# Patient Record
Sex: Female | Born: 1995 | Race: White | Hispanic: No | Marital: Married | Smoking: Never smoker
Health system: Southern US, Community
[De-identification: ages and names within clinical notes are randomized; demographics above are authoritative.]

## PROBLEM LIST (undated history)

## (undated) DIAGNOSIS — N390 Urinary tract infection, site not specified: Secondary | ICD-10-CM

---
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
## 2015-05-27 ENCOUNTER — Emergency Department (HOSPITAL_COMMUNITY): Payer: Medicaid Other

## 2015-05-27 ENCOUNTER — Emergency Department (HOSPITAL_COMMUNITY)
Admission: EM | Admit: 2015-05-27 | Discharge: 2015-05-27 | Disposition: A | Discharge status: Home / Self Care | Payer: Medicaid Other | Attending: Emergency Medicine | Admitting: Emergency Medicine

## 2015-05-27 ENCOUNTER — Encounter (HOSPITAL_COMMUNITY): Payer: Self-pay | Admitting: Emergency Medicine

## 2015-05-27 DIAGNOSIS — Z79899 Other long term (current) drug therapy: Secondary | ICD-10-CM | POA: Diagnosis not present

## 2015-05-27 DIAGNOSIS — M549 Dorsalgia, unspecified: Secondary | ICD-10-CM | POA: Diagnosis not present

## 2015-05-27 DIAGNOSIS — Z3202 Encounter for pregnancy test, result negative: Secondary | ICD-10-CM | POA: Diagnosis not present

## 2015-05-27 DIAGNOSIS — Z87440 Personal history of urinary (tract) infections: Secondary | ICD-10-CM | POA: Diagnosis not present

## 2015-05-27 DIAGNOSIS — R109 Unspecified abdominal pain: Secondary | ICD-10-CM

## 2015-05-27 DIAGNOSIS — R0789 Other chest pain: Secondary | ICD-10-CM | POA: Insufficient documentation

## 2015-05-27 DIAGNOSIS — Z793 Long term (current) use of hormonal contraceptives: Secondary | ICD-10-CM | POA: Insufficient documentation

## 2015-05-27 DIAGNOSIS — R0781 Pleurodynia: Secondary | ICD-10-CM

## 2015-05-27 DIAGNOSIS — Z9889 Other specified postprocedural states: Secondary | ICD-10-CM | POA: Insufficient documentation

## 2015-05-27 HISTORY — DX: Urinary tract infection, site not specified: N39.0

## 2015-05-27 LAB — COMPREHENSIVE METABOLIC PANEL
ALT: 15 U/L (ref 14–54)
ANION GAP: 7 (ref 5–15)
AST: 16 U/L (ref 15–41)
Albumin: 3.4 g/dL — ABNORMAL LOW (ref 3.5–5.0)
Alkaline Phosphatase: 72 U/L (ref 38–126)
BILIRUBIN TOTAL: 0.3 mg/dL (ref 0.3–1.2)
BUN: 8 mg/dL (ref 6–20)
CHLORIDE: 103 mmol/L (ref 101–111)
CO2: 27 mmol/L (ref 22–32)
CREATININE: 0.66 mg/dL (ref 0.44–1.00)
Calcium: 8.6 mg/dL — ABNORMAL LOW (ref 8.9–10.3)
GFR calc Af Amer: >60 mL/min (ref >60)
GFR calc non Af Amer: >60 mL/min (ref >60)
GLUCOSE: 84 mg/dL (ref 65–99)
Potassium: 4.4 mmol/L (ref 3.5–5.1)
Sodium: 137 mmol/L (ref 135–145)
Total Protein: 7.3 g/dL (ref 6.5–8.1)

## 2015-05-27 LAB — CBC WITH DIFFERENTIAL/PLATELET
BASOS ABS: 0 10*3/uL (ref 0.0–0.1)
BASOS PCT: 0 % (ref 0–1)
Eosinophils Absolute: 0.2 10*3/uL (ref 0.0–0.7)
Eosinophils Relative: 2 % (ref 0–5)
HEMATOCRIT: 33 % — AB (ref 36.0–46.0)
Hemoglobin: 10.8 g/dL — ABNORMAL LOW (ref 12.0–15.0)
Lymphocytes Relative: 24 % (ref 12–46)
Lymphs Abs: 1.8 10*3/uL (ref 0.7–4.0)
MCH: 28.2 pg (ref 26.0–34.0)
MCHC: 32.7 g/dL (ref 30.0–36.0)
MCV: 86.2 fL (ref 78.0–100.0)
Monocytes Absolute: 0.3 10*3/uL (ref 0.1–1.0)
Monocytes Relative: 4 % (ref 3–12)
NEUTROS ABS: 5.3 10*3/uL (ref 1.7–7.7)
Neutrophils Relative %: 70 % (ref 43–77)
PLATELETS: 328 10*3/uL (ref 150–400)
RBC: 3.83 MIL/uL — ABNORMAL LOW (ref 3.87–5.11)
RDW: 12.7 % (ref 11.5–15.5)
WBC: 7.7 10*3/uL (ref 4.0–10.5)

## 2015-05-27 LAB — URINALYSIS, ROUTINE W REFLEX MICROSCOPIC
Bilirubin Urine: NEGATIVE
GLUCOSE, UA: NEGATIVE mg/dL
Ketones, ur: NEGATIVE mg/dL
NITRITE: NEGATIVE
PH: 8 (ref 5.0–8.0)
Protein, ur: NEGATIVE mg/dL
Specific Gravity, Urine: 1.01 (ref 1.005–1.030)
Urobilinogen, UA: 0.2 mg/dL (ref 0.0–1.0)

## 2015-05-27 LAB — URINE MICROSCOPIC-ADD ON

## 2015-05-27 LAB — D-DIMER, QUANTITATIVE: D-Dimer, Quant: 3.81 ug/mL-FEU — ABNORMAL HIGH (ref 0.00–0.48)

## 2015-05-27 LAB — PREGNANCY, URINE: Preg Test, Ur: NEGATIVE

## 2015-05-27 MED ORDER — IOHEXOL 350 MG/ML SOLN
100.0000 mL | Freq: Once | INTRAVENOUS | Status: AC | PRN
  Administered 2015-05-27: 100 mL via INTRAVENOUS

## 2015-05-27 MED ORDER — TRAMADOL HCL 50 MG PO TABS: 50.0000 mg | ORAL_TABLET | Freq: Four times a day (QID) | ORAL | Status: AC | PRN

## 2015-05-27 NOTE — ED Notes (Signed)
Patient c/o right flank pain. Per patient worse with walking and deep breath. Patient states pain started in right shoulder, in which she saw PCP and given muscle relaxer. Patient took muscle relaxer and went to sleep waking with pain in flank. Denies any nausea, vomiting, diarrhea, urinary symptoms, or fevers.

## 2015-05-27 NOTE — ED Provider Notes (Signed)
CSN: [id number]     Arrival date & time 05/27/15  1027 History   This chart was scribed for Burgess AmorJulie Rolf Fells, PA-C working with No att. providers found by Elveria Risingimelie Horne, ED Scribe. This patient was seen in room APA10/APA10 and the patient's care was started at 11:55 AM.   Chief Complaint  Patient presents with  . Flank Pain   The history is provided by the patient. No language interpreter was used.   HPI Comments: Stacy Hughes is a 19 y.o. female who presents to the Emergency Department complaining of right shoulder and right flank pain onset six days ago. Patient reports initiation of pain in her right shoulder for which she was evaluated (two days ago) and prescribed a muscle relaxer. Patient shares that her PCP attributed her pain to a "pulled muscle." Patient explains that she was experiencing associated right flank pain with her shoulder pain, but the pain to her right flank worsened after her PCP visit and treatment with the muscle relaxer. Patient describes sharp, intermittent flank pain that is exacerbated with movement and deep breathing. Patient shares recent diagnosis and antibiotic treatment, with Macrobid, for a UTI approximately one week ago. Patient reports urinary frequency as her only symptoms that has since resolved. Patient shares that when when she was diagnosed with the UTI she was menstruating and experiencing more intense cramping. Patient adds that once this pain resolved, her right shoulder pain developed. Patient denies fever, coughing, abdominal pain, wheezing, shortness of breath, urinary symptoms, nausea, or vomiting. 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 Abdominal surgeries include a single Cesarean section; approximately 1.5 years ago.  Patient denies alcohol or drug use.  Patient taking oral contraceptive.   Past Medical History  Diagnosis Date  . UTI (lower urinary tract infection)    Past Surgical History  Procedure Laterality Date  . Cesarean  section     History reviewed. No pertinent family history. History  Substance Use Topics  . Smoking status: Never Smoker   . Smokeless tobacco: Never Used  . Alcohol Use: No   OB History    Gravida Para Term Preterm AB TAB SAB Ectopic Multiple Living   1 1 1       1      Review of Systems  Constitutional: Negative for fever and chills.  Respiratory: Negative for cough, shortness of breath and wheezing.   Cardiovascular: Negative for chest pain and leg swelling.  Gastrointestinal: Negative for nausea, vomiting, abdominal pain, constipation and abdominal distention.  Genitourinary: Positive for flank pain. Negative for dysuria, urgency, frequency and difficulty urinating.  Musculoskeletal: Positive for back pain. Negative for joint swelling and gait problem.  Skin: Negative for rash.  Neurological: Negative for weakness and numbness.    Allergies  Review of patient's allergies indicates no known allergies.  Home Medications   Prior to Admission medications   Medication Sig Start Date End Date Taking? Authorizing Provider  ibuprofen (ADVIL,MOTRIN) 200 MG tablet Take 400-600 mg by mouth every 6 (six) hours as needed for moderate pain.   Yes Historical Provider, MD  Norgestimate-Ethinyl Estradiol Triphasic (ORTHO TRI-CYCLEN LO) 0.18/0.215/0.25 MG-25 MCG tab Take 1 tablet by mouth daily.   Yes Historical Provider, MD  nitrofurantoin, macrocrystal-monohydrate, (MACROBID) 100 MG capsule Take 100 mg by mouth 2 (two) times daily.    Historical Provider, MD  traMADol (ULTRAM) 50 MG tablet Take 1 tablet (50 mg total) by mouth every 6 (six) hours as needed for moderate pain. 05/27/15  Burgess Amor, PA-C   Triage Vitals: BP 118/73 mmHg  Pulse 85  Temp(Src) 98 F (36.7 C) (Oral)  Resp 16  Ht 5\' 7"  (1.702 m)  Wt 204 lb (92.534 kg)  BMI 31.94 kg/m2  SpO2 99%  LMP 05/20/2015 Physical Exam  Constitutional: She appears well-developed and well-nourished.  HENT:  Head: Normocephalic and  atraumatic.  Eyes: Conjunctivae are normal.  Neck: Normal range of motion.  Cardiovascular: Normal rate, regular rhythm, normal heart sounds and intact distal pulses.   Pulmonary/Chest: Effort normal and breath sounds normal. No respiratory distress. She has no wheezes. She has no rales.  Decreased breath sounds at right base with no wheezing or rhonchi.   Abdominal: Soft. Bowel sounds are normal. She exhibits no distension and no mass. There is no tenderness. There is no rebound and no guarding.  Musculoskeletal: Normal range of motion.  No ankle edema. Calves are nontender. No pain with active or passive ROM of right shoulder. Nontender to palpation.   Neurological: She is alert.  Skin: Skin is warm and dry.  Psychiatric: She has a normal mood and affect.  Nursing note and vitals reviewed.   ED Course  Procedures (including critical care time)  COORDINATION OF CARE: 12:09 PM- Plans to order blood work. Discussed treatment plan with patient at bedside and patient agreed to plan.   Labs Review Labs Reviewed  URINALYSIS, ROUTINE W REFLEX MICROSCOPIC (NOT AT [facility]) - Abnormal; Notable for the following:    Hgb urine dipstick TRACE (*)    Leukocytes, UA SMALL (*)    All other components within normal limits  URINE MICROSCOPIC-ADD ON - Abnormal; Notable for the following:    Squamous Epithelial / LPF FEW (*)    All other components within normal limits  CBC WITH DIFFERENTIAL/PLATELET - Abnormal; Notable for the following:    RBC 3.83 (*)    Hemoglobin 10.8 (*)    HCT 33.0 (*)    All other components within normal limits  COMPREHENSIVE METABOLIC PANEL - Abnormal; Notable for the following:    Calcium 8.6 (*)    Albumin 3.4 (*)    All other components within normal limits  D-DIMER, QUANTITATIVE (NOT AT [facility]) - Abnormal; Notable for the following:    D-Dimer, Quant 3.81 (*)    All other components within normal limits  PREGNANCY, URINE    Imaging Review Dg Chest 2  View  05/27/2015   CLINICAL DATA:  Patient reports pleuritic chest pain.  EXAM: CHEST  2 VIEW  COMPARISON:  None.  FINDINGS: Normal cardiac and mediastinal contours. No consolidative pulmonary opacity. No pleural effusion or pneumothorax. Regional skeleton is unremarkable.  IMPRESSION: No acute cardiopulmonary process.   Electronically Signed   By: Annia Belt M.D.   On: 05/27/2015 13:05   Ct Angio Chest Pe W/cm &/or Wo Cm  05/27/2015   CLINICAL DATA:  Patient with right-sided pleuritic chest pain for 6 days.  EXAM: CT ANGIOGRAPHY CHEST WITH CONTRAST  TECHNIQUE: Multidetector CT imaging of the chest was performed using the standard protocol during bolus administration of intravenous contrast. Multiplanar CT image reconstructions and MIPs were obtained to evaluate the vascular anatomy.  CONTRAST:  OMNIPAQUE IOHEXOL 350 MG/ML SOLN  COMPARISON:  Chest radiograph 05/27/2015  FINDINGS: Mediastinum/Nodes: No enlarged axillary, mediastinal or hilar lymphadenopathy. Normal heart size. No pericardial effusion. Aorta main pulmonary artery normal in caliber.  No evidence for pulmonary embolism.  Lungs/Pleura: Central airways are patent. Tiny bilateral pleural effusions. Dependent ground-glass opacities  within the bilateral lower lobes. No pneumothorax.  Upper abdomen: Unremarkable  Musculoskeletal: No aggressive or acute appearing osseous lesions.  Review of the MIP images confirms the above findings.  IMPRESSION: No evidence for pulmonary embolism.  Small bilateral pleural effusions. Dependent atelectasis within the bilateral lower lobes.   Electronically Signed   By: Annia Belt M.D.   On: 05/27/2015 14:46   US Abdomen Complete  05/27/2015   CLINICAL DATA:  Abdominal pain.  EXAM: ULTRASOUND ABDOMEN COMPLETE  COMPARISON:  None.  FINDINGS: Gallbladder: No gallstones or wall thickening visualized. No sonographic Murphy sign noted.  Common bile duct: Diameter: 2.4 mm  Liver: Normal echogenicity without focal lesion  or biliary dilatation.  IVC: Normal caliber  Pancreas: Sonographically unremarkable  Spleen: Normal size and echogenicity without focal lesions  Right Kidney: Length: 11.0 cm. Normal renal cortical thickness and echogenicity without focal lesions or hydronephrosis.  Left Kidney: Length: 11.0 cm. Normal renal cortical thickness and echogenicity without focal lesions or hydronephrosis.  Abdominal aorta: No aneurysm visualized.  Other findings: Trace of free fluid in Morison's pouch.  IMPRESSION: Unremarkable abdominal ultrasound examination.   Electronically Signed   By: Rudie Meyer M.D.   On: 05/27/2015 13:25     EKG Interpretation None      MDM   Final diagnoses:  Pleuritic chest pain  Flank pain, acute    Patients labs and/or radiological studies were reviewed and considered during the medical decision making and disposition process.  Results were also discussed with patient. Pt prescribed tramadol, also encouraged ibuprofen (pt has).  F/u with pcp in 1 week if sx persist.  I personally performed the services described in this documentation, which was scribed in my presence. The recorded information has been reviewed and is accurate.   Burgess Amor, PA-C 05/28/15 1720  Vanetta Mulders, MD 05/30/15 [phone]

## 2015-05-27 NOTE — Discharge Instructions (Signed)
Flank Pain °Flank pain refers to pain that is located on the side of the body between the upper abdomen and the back. The pain may occur over a short period of time (acute) or may be long-term or reoccurring (chronic). It may be mild or severe. Flank pain can be caused by many things. °CAUSES  °Some of the more common causes of flank pain include: °· Muscle strains.   °· Muscle spasms.   °· A disease of your spine (vertebral disk disease).   °· A lung infection (pneumonia).   °· Fluid around your lungs (pulmonary edema).   °· A kidney infection.   °· Kidney stones.   °· A very painful skin rash caused by the chickenpox virus (shingles).   °· Gallbladder disease.   °HOME CARE INSTRUCTIONS  °Home care will depend on the cause of your pain. In general, °· Rest as directed by your caregiver. °· Drink enough fluids to keep your urine clear or pale yellow. °· Only take over-the-counter or prescription medicines as directed by your caregiver. Some medicines may help relieve the pain. °· Tell your caregiver about any changes in your pain. °· Follow up with your caregiver as directed. °SEEK IMMEDIATE MEDICAL CARE IF:  °· Your pain is not controlled with medicine.   °· You have new or worsening symptoms. °· Your pain increases.   °· You have abdominal pain.   °· You have shortness of breath.   °· You have persistent nausea or vomiting.   °· You have swelling in your abdomen.   °· You feel faint or pass out.   °· You have blood in your urine. °· You have a fever or persistent symptoms for more than 2-3 days. °· You have a fever and your symptoms suddenly get worse. °MAKE SURE YOU:  °· Understand these instructions. °· Will watch your condition. °· Will get help right away if you are not doing well or get worse. °Document Released: 12/25/2005 Document Revised: 07/28/2012 Document Reviewed: 06/17/2012 °[employer]® Patient Information ©2015 [employer]. This information is not intended to replace advice given to you by your  health care provider. Make sure you discuss any questions you have with your health care provider. ° °

## 2016-02-03 IMAGING — US US ABDOMEN COMPLETE
1 series · 14 of 25 positions shown · non-contrast
Comparison: None.

CLINICAL DATA: Abdominal pain.

EXAM:
ULTRASOUND ABDOMEN COMPLETE

[Series 1: us abdomen complete · 0.18mm/px · 14 of 99 slices shown]
[im 1/99]
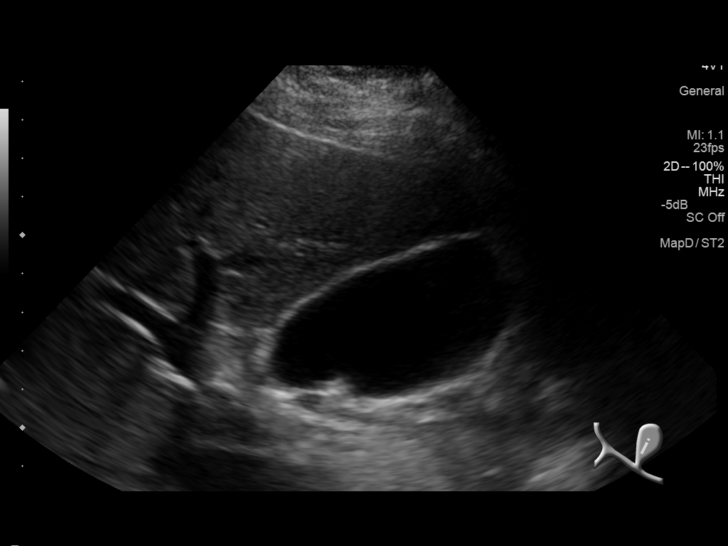
[im 9/99]
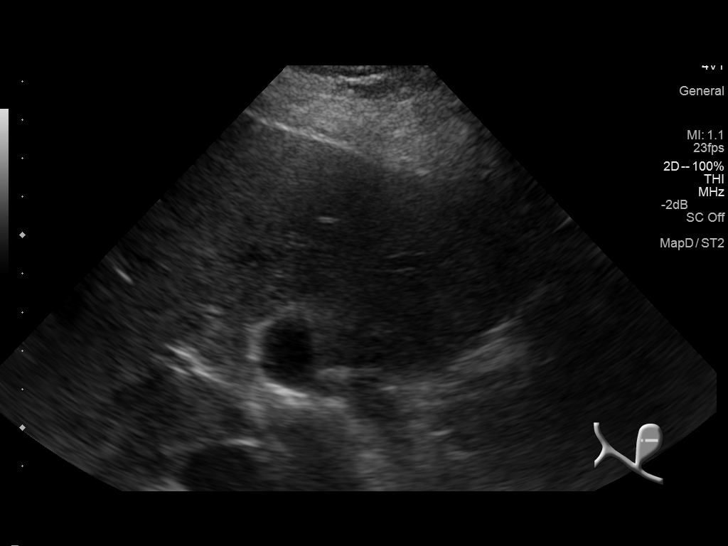
[im 17/99]
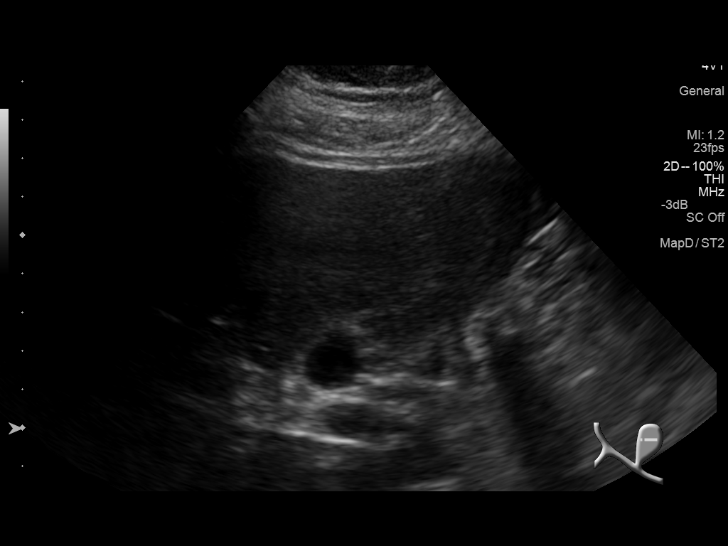
[im 25/99]
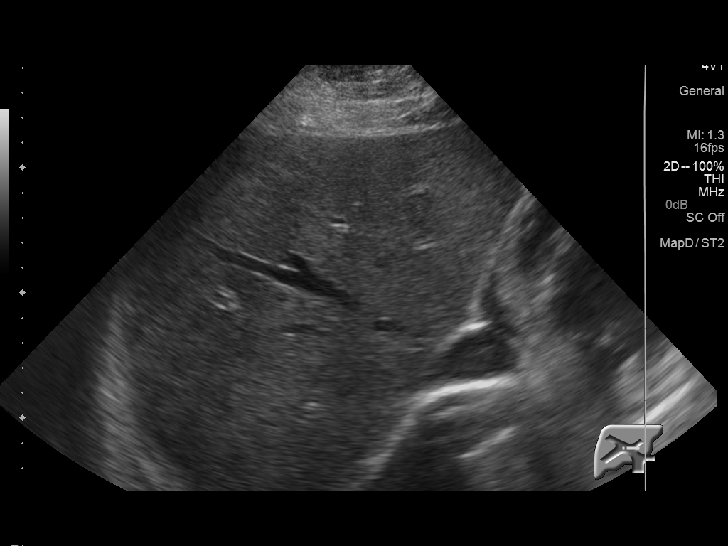
[im 33/99]
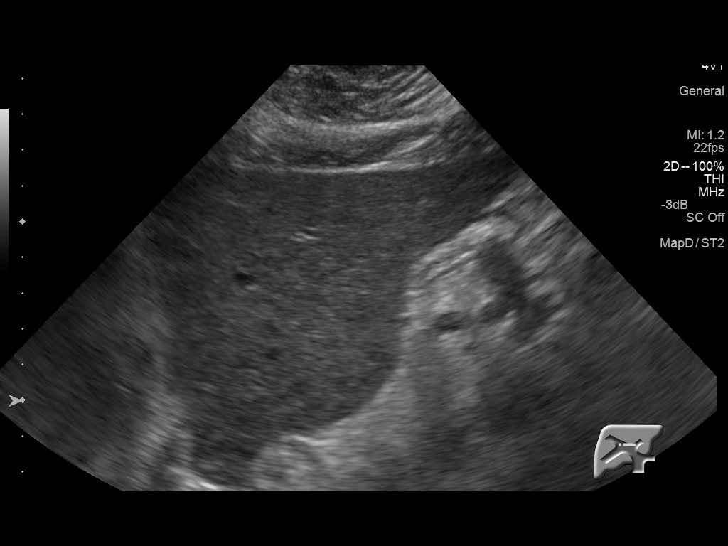
[im 37/99]
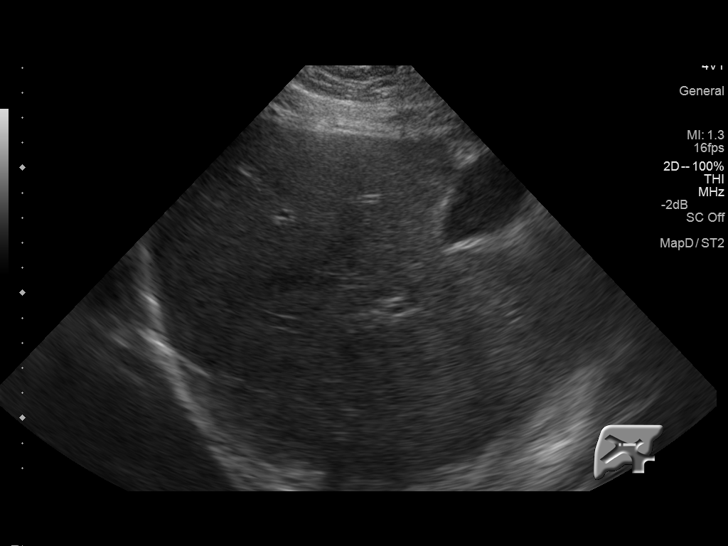
[im 45/99]
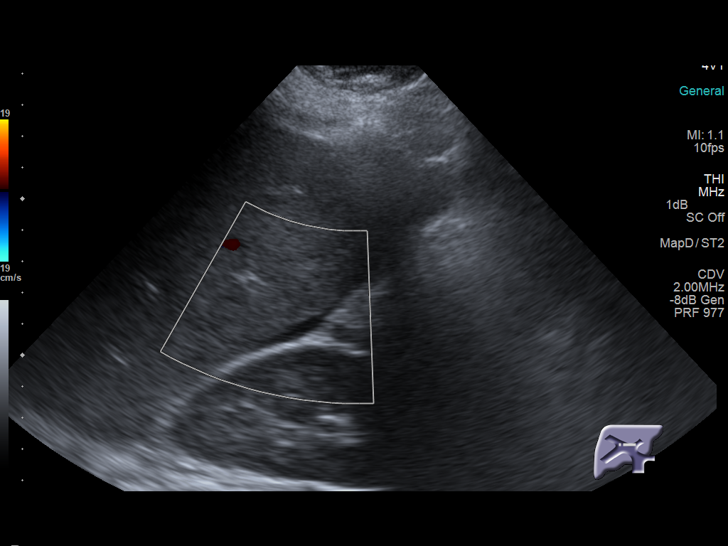
[im 54/99]
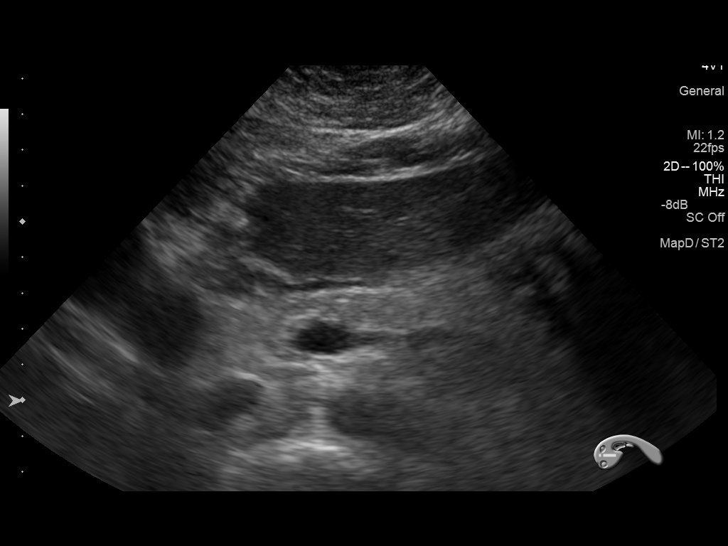
[im 62/99]
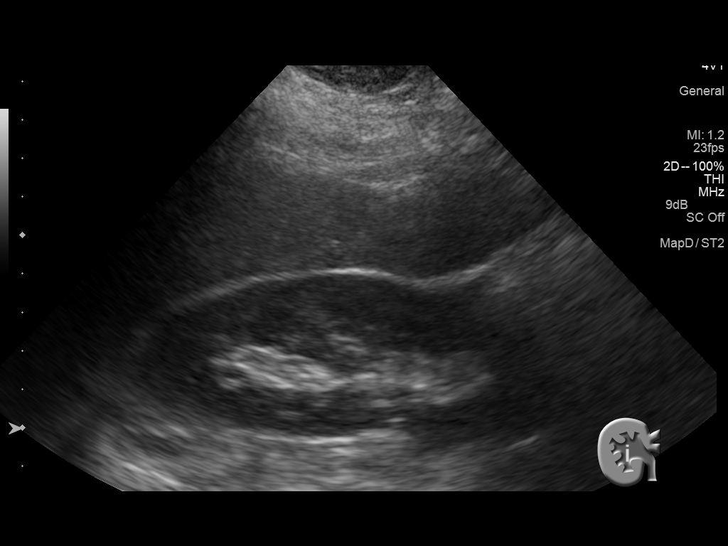
[im 66/99]
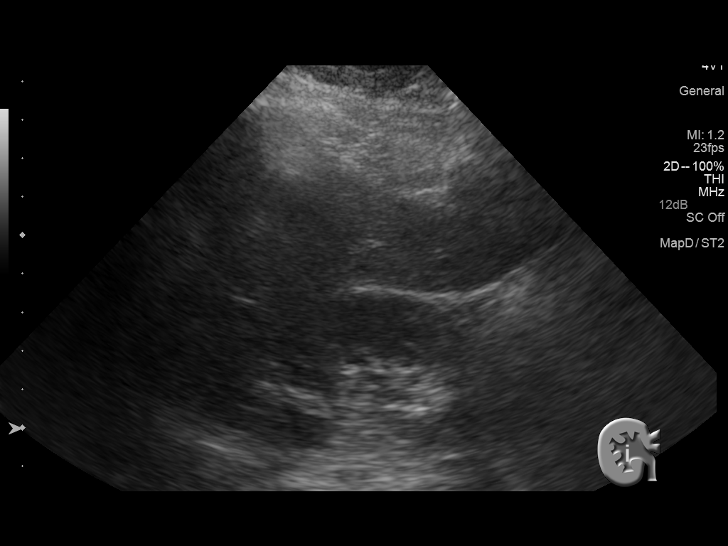
[im 74/99]
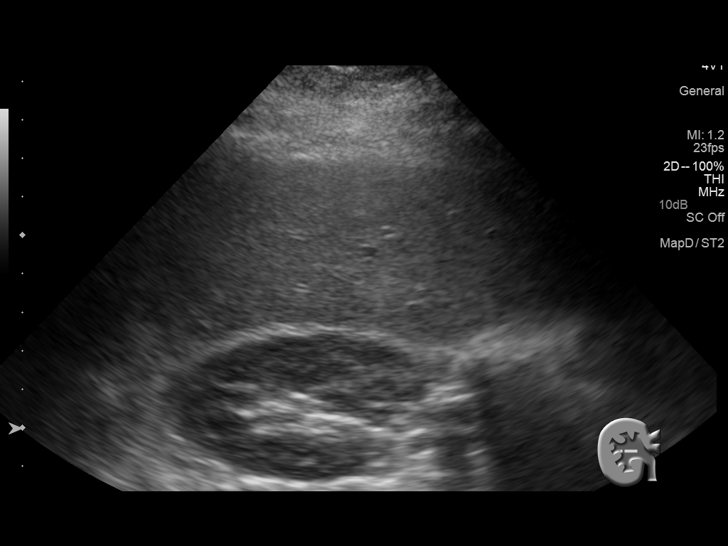
[im 82/99]
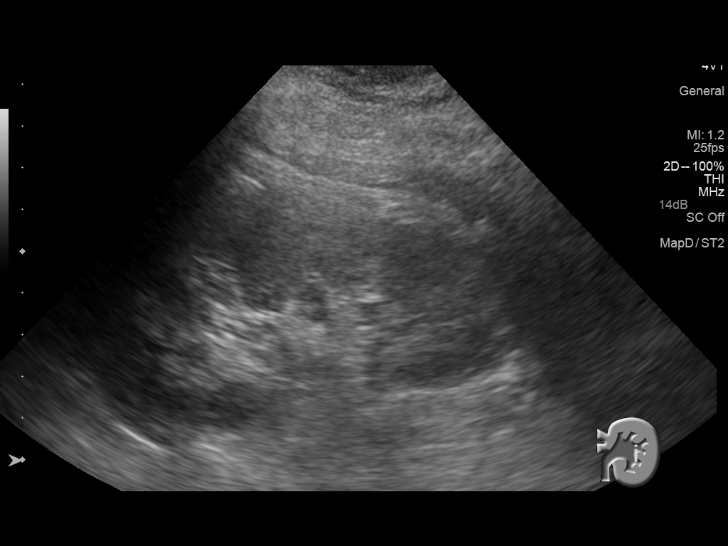
[im 90/99]
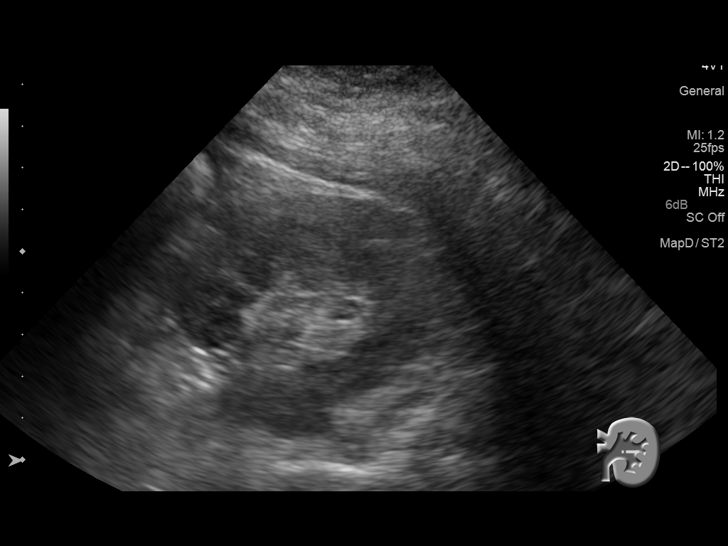
[im 99/99]
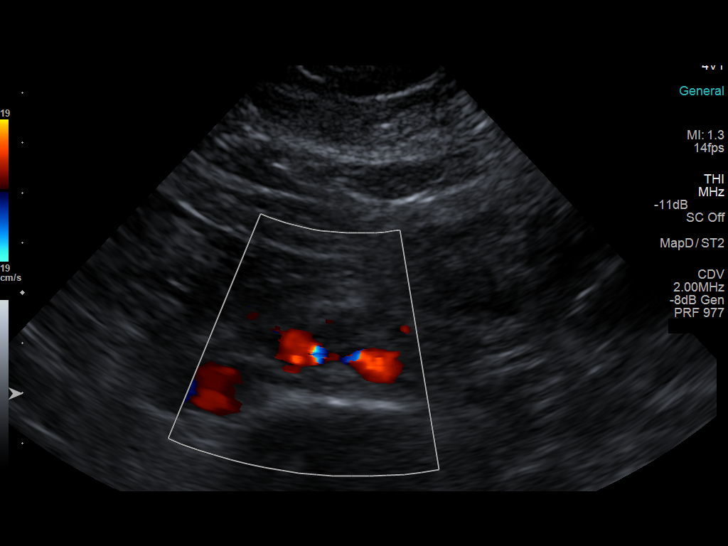

[14 of 25 positions shown; findings below may reference images not displayed]

FINDINGS: Gallbladder: No gallstones or wall thickening visualized. No
sonographic Murphy sign noted.

Common bile duct: Diameter: 2.4 mm

Liver: Normal echogenicity without focal lesion or biliary
dilatation.

IVC: Normal caliber

Pancreas: Sonographically unremarkable

Spleen: Normal size and echogenicity without focal lesions

Right Kidney: Length: 11.0 cm. Normal renal cortical thickness and
echogenicity without focal lesions or hydronephrosis.

Left Kidney: Length: 11.0 cm. Normal renal cortical thickness and
echogenicity without focal lesions or hydronephrosis.

Abdominal aorta: No aneurysm visualized.

Other findings: Trace of free fluid in Morison's pouch.
IMPRESSION: Unremarkable abdominal ultrasound examination.

## 2016-08-20 IMAGING — DX DG CHEST 2V
2 series · 2 of 2 positions shown · non-contrast
Comparison: None.

CLINICAL DATA: Patient reports pleuritic chest pain.

EXAM:
CHEST  2 VIEW

[chest pa]
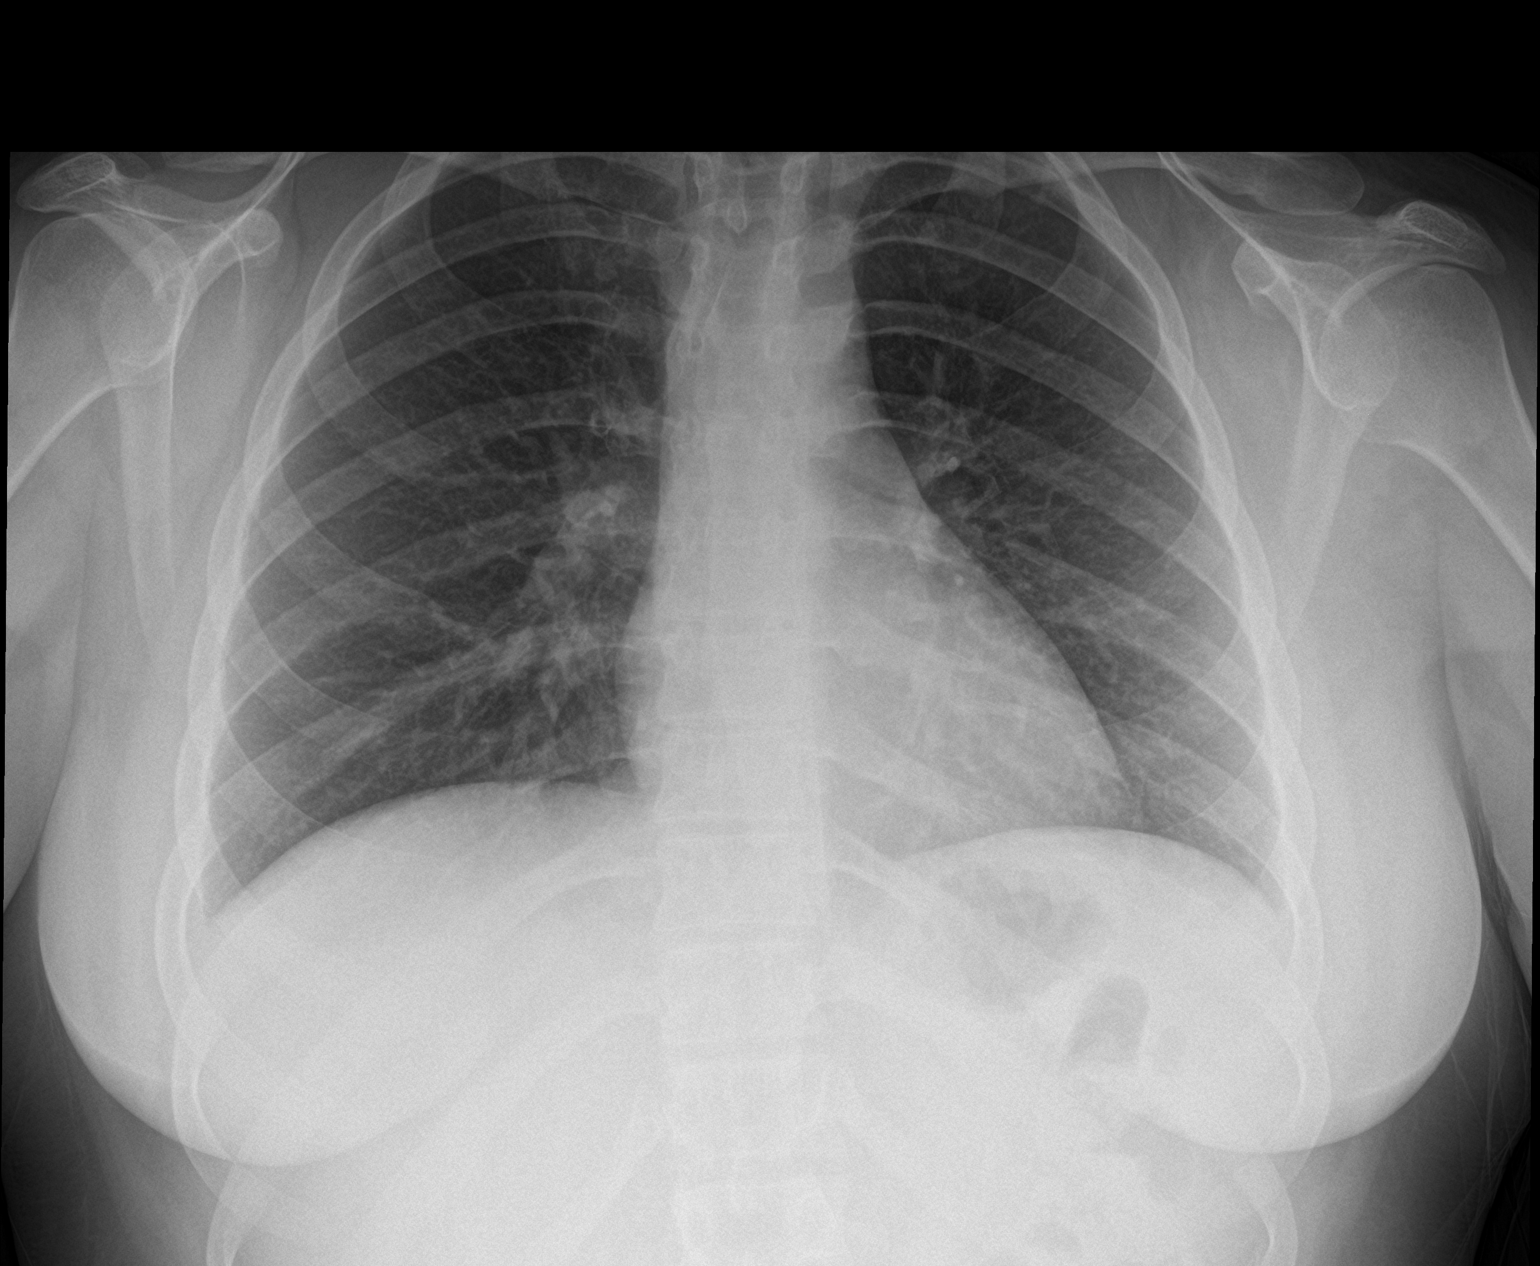

[chest lat]
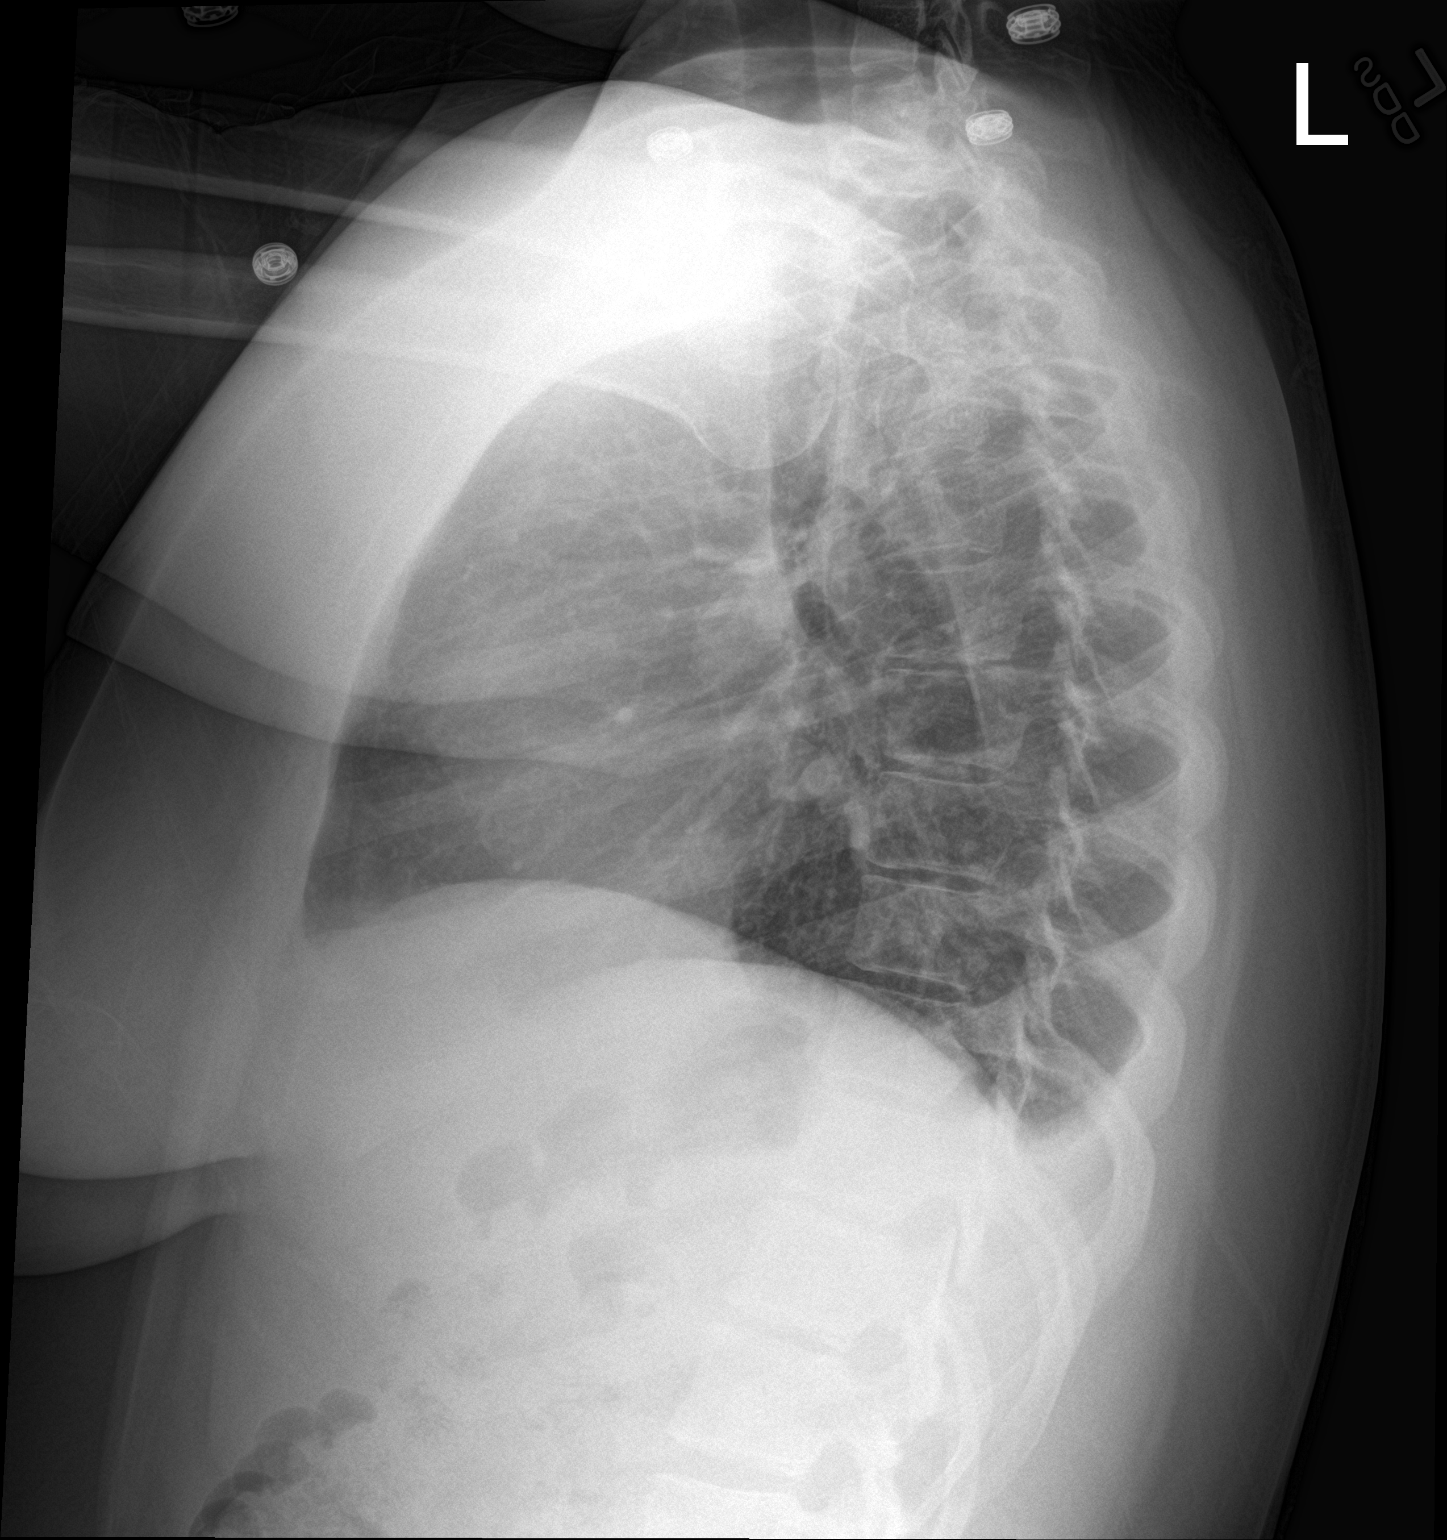

[2 of 2 positions shown; findings below may reference images not displayed]

FINDINGS: Normal cardiac and mediastinal contours. No consolidative pulmonary
opacity. No pleural effusion or pneumothorax. Regional skeleton is
unremarkable.
IMPRESSION: No acute cardiopulmonary process.

## 2016-08-20 IMAGING — CT CT ANGIO CHEST
1 of 6 series · 5 of 36 positions shown · IV contrast (Omnipaque 300)
Comparison: Chest radiograph 05/27/2015

CLINICAL DATA: Patient with right-sided pleuritic chest pain for 6
days.

EXAM:
CT ANGIOGRAPHY CHEST WITH CONTRAST
TECHNIQUE: Multidetector CT imaging of the chest was performed using the
standard protocol during bolus administration of intravenous
contrast. Multiplanar CT image reconstructions and MIPs were
obtained to evaluate the vascular anatomy.
CONTRAST:  100mL OMNIPAQUE IOHEXOL 350 MG/ML SOLN

[Series 7: pe 3.0 b40f · axial · 0.73mm/px · z∈[-258,-78]mm · 5 of 91 slices shown]
[im 16/91  lung]
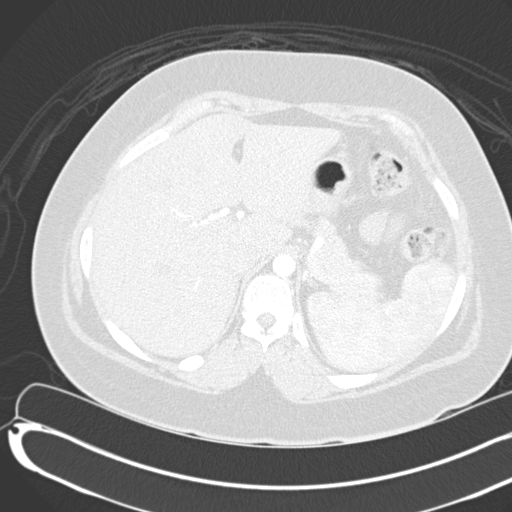
[im 31/91  mediastinal]
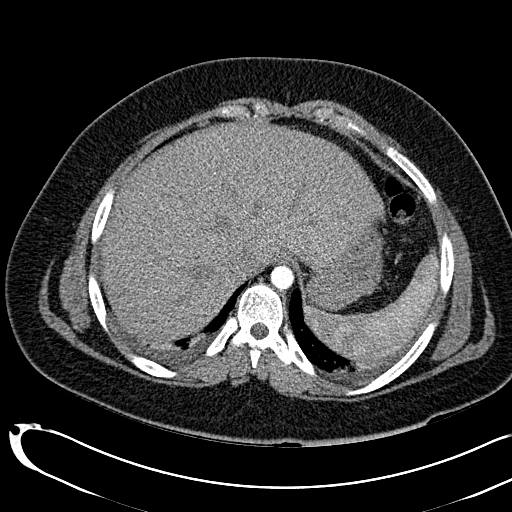
[im 46/91  lung]
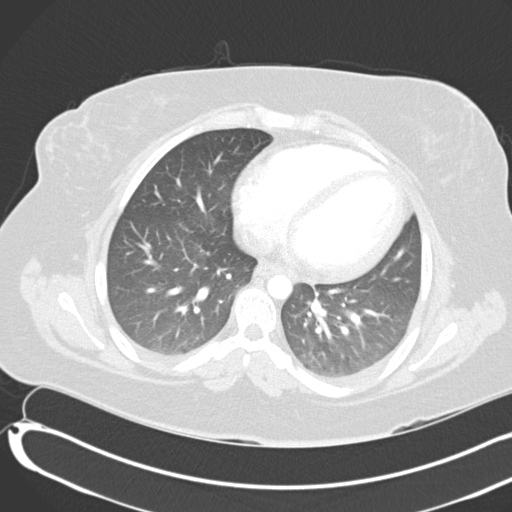
[im 61/91  mediastinal]
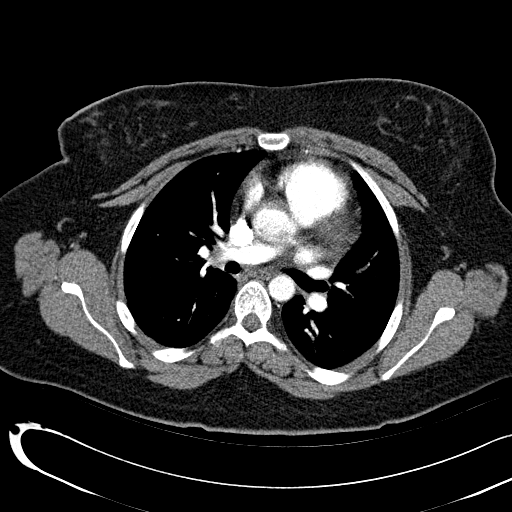
[im 76/91  lung]
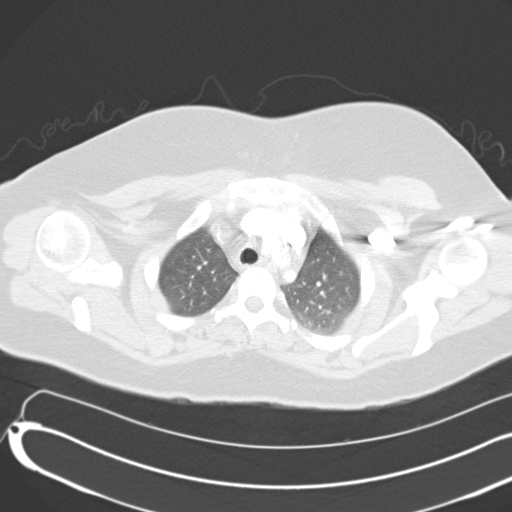

[5 of 36 positions shown; findings below may reference images not displayed]

FINDINGS: Mediastinum/Nodes: No enlarged axillary, mediastinal or hilar
lymphadenopathy. Normal heart size. No pericardial effusion. Aorta
main pulmonary artery normal in caliber.

No evidence for pulmonary embolism.

Lungs/Pleura: Central airways are patent. Tiny bilateral pleural
effusions. Dependent ground-glass opacities within the bilateral
lower lobes. No pneumothorax.

Upper abdomen: Unremarkable

Musculoskeletal: No aggressive or acute appearing osseous lesions.

Review of the MIP images confirms the above findings.
IMPRESSION: No evidence for pulmonary embolism.

Small bilateral pleural effusions. Dependent atelectasis within the
bilateral lower lobes.

## 2023-11-17 ENCOUNTER — Other Ambulatory Visit: Payer: Self-pay

## 2023-11-17 ENCOUNTER — Encounter (HOSPITAL_COMMUNITY): Payer: Self-pay

## 2023-11-17 ENCOUNTER — Emergency Department (HOSPITAL_COMMUNITY)
Admission: EM | Admit: 2023-11-17 | Discharge: 2023-11-17 | Disposition: A | Discharge status: Home / Self Care | Payer: Medicaid Other

## 2023-11-17 DIAGNOSIS — L509 Urticaria, unspecified: Secondary | ICD-10-CM | POA: Insufficient documentation

## 2023-11-17 DIAGNOSIS — R21 Rash and other nonspecific skin eruption: Secondary | ICD-10-CM | POA: Diagnosis present

## 2023-11-17 MED ORDER — PREDNISONE 50 MG PO TABS
60.0000 mg | ORAL_TABLET | Freq: Once | ORAL | Status: AC
  Administered 2023-11-17: 60 mg via ORAL
  Filled 2023-11-17: qty 1

## 2023-11-17 MED ORDER — PREDNISONE 10 MG PO TABS: ORAL_TABLET | ORAL | 0 refills | Status: DC

## 2023-11-17 NOTE — Discharge Instructions (Signed)
 Given the history and nature of this rash I suspect that this is a histamine reaction either to your new shower gel or a possible insect bite as discussed.  Antihistamine such as Benadryl will help with the itch, as discussed if Benadryl causes drowsiness you may substitute Claritin or Zyrtec in its place.  You are also being placed on a prednisone  taper to help break this immune system reaction.  Take your next dose of prednisone  tomorrow as you have received today's dose here.  Get rechecked if your rash worsens or you develop fever or any new symptoms, however I suspect this should fade away with this treatment plan.  I would avoid hot showers which that can drive and may course an allergic histamine reaction.

## 2023-11-17 NOTE — ED Triage Notes (Signed)
 Pt c/o red rash all over her body. Pt states a week ago trying a new body wash and the next day waking up to a bite mark on her left arm. Pt states after the bite mark she had a rash to bilateral legs and stomach. Pt went back to her old body wash and said after benadryl and hydrocortisone creme the rash went away. Pt states waking up this morning and rash is back worse and all over body. Pt states itches some but not horrible.

## 2023-11-17 NOTE — ED Provider Notes (Signed)
 [place] [facility] AT [facility] Provider Note   CSN: [id number] Arrival date & time: 11/17/23  0932     History  Chief Complaint  Patient presents with   Rash    Stacy Hughes is a 27 y.o. female with no significant past medical history presenting for evaluation of a rash which started 9 days ago.  She reports using a new shower gel the night before her symptoms started, then woke the next morning with a pruritic erythematous patch on her left medial upper arm after which she developed a more diffuse rash on her abdomen and arms.  When she woke that morning she initially thought that maybe something bit her as there was a small area of induration in the middle of the itchy erythematous patch on her left arm.  She notes there have been lady bugs in her bedroom and perhaps 1 bit her.  Since then this pruritic patch on the arm has resolved and she treated the remainder of her rash with topical hydrocortisone and Benadryl, it faded out but has since returned again to her arms abdomen little bit on her back and her medial thighs.  It is mildly pruritic.  She denies fevers, sore throat, throat swelling, wheezing or shortness of breath.  No recent illnesses, no new medicines.  She had return to her old body wash after the first symptoms started.  The history is provided by the patient.       Home Medications Prior to Admission medications   Medication Sig Start Date End Date Taking? Authorizing Provider  predniSONE  (DELTASONE ) 10 MG tablet 6, 5, 4, 3, 2 then 1 tablet by mouth daily for 6 days total. 11/17/23  Yes Dael Howland, PA-C  ibuprofen (ADVIL,MOTRIN) 200 MG tablet Take 400-600 mg by mouth every 6 (six) hours as needed for moderate pain.    [provider]  nitrofurantoin, macrocrystal-monohydrate, (MACROBID) 100 MG capsule Take 100 mg by mouth 2 (two) times daily.    [provider]  Norgestimate-Ethinyl Estradiol Triphasic (ORTHO TRI-CYCLEN LO)  0.18/0.215/0.25 MG-25 MCG tab Take 1 tablet by mouth daily.    [provider]  traMADol  (ULTRAM ) 50 MG tablet Take 1 tablet (50 mg total) by mouth every 6 (six) hours as needed for moderate pain. 05/27/15   Milliani Herrada, PA-C      Allergies    Patient has no known allergies.    Review of Systems   Review of Systems  Constitutional:  Negative for chills and fever.  HENT:  Negative for congestion, sore throat and trouble swallowing.   Eyes: Negative.   Respiratory:  Negative for chest tightness, shortness of breath, wheezing and stridor.   Cardiovascular:  Negative for chest pain.  Gastrointestinal: Negative.   Genitourinary: Negative.   Musculoskeletal:  Negative for arthralgias, joint swelling and neck pain.  Skin:  Positive for rash. Negative for wound.  Neurological: Negative.   Psychiatric/Behavioral: Negative.    All other systems reviewed and are negative.   Physical Exam Updated Vital Signs BP 128/85 (BP Location: Right Arm)   Pulse 85   Temp 98.6 F (37 C) (Oral)   Resp 16   Ht 5' 7 (1.702 m)   Wt 90.7 kg   LMP 10/28/2023   SpO2 100%   BMI 31.32 kg/m  Physical Exam Constitutional:      General: She is not in acute distress.    Appearance: She is well-developed.  HENT:     Head: Normocephalic.  Mouth/Throat:     Mouth: Mucous membranes are moist.     Pharynx: Oropharynx is clear.  Cardiovascular:     Rate and Rhythm: Normal rate.  Pulmonary:     Effort: Pulmonary effort is normal.     Breath sounds: No stridor. No wheezing.  Musculoskeletal:        General: Normal range of motion.     Cervical back: Neck supple.  Skin:    Findings: Rash present. Rash is macular and urticarial.     Comments: Blanching,  most prominent on trunk,  lighter in appearance arms, patch at right medial knee.      ED Results / Procedures / Treatments   Labs (all labs ordered are listed, but only abnormal results are displayed) Labs Reviewed - No data to  display  EKG None  Radiology No results found.  Procedures Procedures    Medications Ordered in ED Medications  predniSONE  (DELTASONE ) tablet 60 mg (has no administration in time range)    ED Course/ Medical Decision Making/ A&P                                 Medical Decision Making Exam and history consistent with a histamine reaction, is unclear whether she may have been bit by an insect which was the trigger versus the new shower gel which she has since stopped using.  No fevers, no mouth or hand lesions.  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  She is placed on a prednisone  taper, also encouraged to continue Benadryl or Claritin or Zyrtec if Benadryl makes her drowsy which she does endorse.  Return precautions were outlined,.  Follow-up anticipated.  Risk Prescription drug management.           Final Clinical Impression(s) / ED Diagnoses Final diagnoses:  Rash    Rx / DC Orders ED Discharge Orders          Ordered    predniSONE  (DELTASONE ) 10 MG tablet        11/17/23 1055              Birdena Clarity, PA-C 11/17/23 1107    Neysa Caron PARAS, DO 11/17/23 1554

## 2024-01-27 ENCOUNTER — Other Ambulatory Visit: Payer: Self-pay

## 2024-01-27 ENCOUNTER — Emergency Department (HOSPITAL_COMMUNITY)
Admission: EM | Admit: 2024-01-27 | Discharge: 2024-01-27 | Disposition: A | Discharge status: Home / Self Care | Attending: Student | Admitting: Student

## 2024-01-27 ENCOUNTER — Encounter (HOSPITAL_COMMUNITY): Payer: Self-pay | Admitting: Emergency Medicine

## 2024-01-27 DIAGNOSIS — J029 Acute pharyngitis, unspecified: Secondary | ICD-10-CM | POA: Insufficient documentation

## 2024-01-27 LAB — GROUP A STREP BY PCR: Group A Strep by PCR: NOT DETECTED

## 2024-01-27 LAB — RESP PANEL BY RT-PCR (RSV, FLU A&B, COVID)  RVPGX2
Influenza A by PCR: NEGATIVE
Influenza B by PCR: NEGATIVE
Resp Syncytial Virus by PCR: NEGATIVE
SARS Coronavirus 2 by RT PCR: NEGATIVE

## 2024-01-27 LAB — MONONUCLEOSIS SCREEN: Mono Screen: NEGATIVE

## 2024-01-27 MED ORDER — LIDOCAINE VISCOUS HCL 2 % MT SOLN
15.0000 mL | Freq: Once | OROMUCOSAL | Status: AC
  Administered 2024-01-27: 15 mL via OROMUCOSAL
  Filled 2024-01-27: qty 15

## 2024-01-27 MED ORDER — METHYLPREDNISOLONE 4 MG PO TBPK: ORAL_TABLET | ORAL | 0 refills | Status: AC

## 2024-01-27 MED ORDER — DEXAMETHASONE 10 MG/ML FOR PEDIATRIC ORAL USE
10.0000 mg | Freq: Once | INTRAMUSCULAR | Status: AC
  Administered 2024-01-27: 10 mg via ORAL
  Filled 2024-01-27: qty 1

## 2024-01-27 NOTE — ED Triage Notes (Signed)
 Pt reports she had what she feels like was the flu around 3 weeks ago. She reports all symptoms have resolved except for a sore throat with pain while trying to swallow. PT has tried several OTC remedies that have been unsuccessful. Pt denies difficulty breathing. Pt's intake has been effected due to pain while swallowing.

## 2024-01-27 NOTE — ED Provider Notes (Signed)
 [place] EMERGENCY DEPARTMENT AT [facility] Provider Note  CSN: [id number] Arrival date & time: 01/27/24 4098  Chief Complaint(s) Sore Throat  HPI Stacy Hughes is a 28 y.o. female who presents emergency room for evaluation of sore throat.  States that she had a flulike illness approximately 3 weeks ago that had sore throat, body aches, chills, and cough.  She states that most of the symptoms have resolved but her sore throat has been lingering and she is having trouble tolerating p.o.  Has no pain in the neck and has full range of motion of the neck.  No muffled voice.  States that she feels a globus sensation near the hyoid.  Denies chest pain, shortness of breath, abdominal pain, nausea, vomiting or other systemic symptoms.   Past Medical History Past Medical History:  Diagnosis Date   UTI (lower urinary tract infection)    There are no active problems to display for this patient.  Home Medication(s) Prior to Admission medications   Medication Sig Start Date End Date Taking? Authorizing Provider  ibuprofen (ADVIL,MOTRIN) 200 MG tablet Take 400-600 mg by mouth every 6 (six) hours as needed for moderate pain.    [provider]  nitrofurantoin, macrocrystal-monohydrate, (MACROBID) 100 MG capsule Take 100 mg by mouth 2 (two) times daily.    [provider]  Norgestimate-Ethinyl Estradiol Triphasic (ORTHO TRI-CYCLEN LO) 0.18/0.215/0.25 MG-25 MCG tab Take 1 tablet by mouth daily.    [provider]  predniSONE (DELTASONE) 10 MG tablet 6, 5, 4, 3, 2 then 1 tablet by mouth daily for 6 days total. 11/17/23   Idol, Raynelle Fanning, PA-C  traMADol (ULTRAM) 50 MG tablet Take 1 tablet (50 mg total) by mouth every 6 (six) hours as needed for moderate pain. 05/27/15   Victoriano Lain                                                                                                                                    Past Surgical History Past Surgical History:   Procedure Laterality Date   CESAREAN SECTION     Family History History reviewed. No pertinent family history.  Social History Social History   Tobacco Use   Smoking status: Never   Smokeless tobacco: Never  Substance Use Topics   Alcohol use: No   Drug use: No   Allergies Patient has no known allergies.  Review of Systems Review of Systems  HENT:  Positive for sore throat.     Physical Exam Vital Signs  I have reviewed the triage vital signs BP (!) 151/94 (BP Location: Right Arm)   Pulse 70   Temp 98 F (36.7 C) (Oral)   Resp 16   Ht 5\' 7"  (1.702 m)   Wt 97.5 kg   LMP 12/28/2023   SpO2 99%   BMI 33.67 kg/m   Physical Exam Vitals and nursing note reviewed.  Constitutional:  General: She is not in acute distress.    Appearance: She is well-developed.  HENT:     Head: Normocephalic and atraumatic.     Ears:     Comments: Bilateral tonsillar swelling with minimal exudate Eyes:     Conjunctiva/sclera: Conjunctivae normal.  Cardiovascular:     Rate and Rhythm: Normal rate and regular rhythm.     Heart sounds: No murmur heard. Pulmonary:     Effort: Pulmonary effort is normal. No respiratory distress.     Breath sounds: Normal breath sounds.  Abdominal:     Palpations: Abdomen is soft.     Tenderness: There is no abdominal tenderness.  Musculoskeletal:        General: No swelling.     Cervical back: Neck supple.  Skin:    General: Skin is warm and dry.     Capillary Refill: Capillary refill takes less than 2 seconds.  Neurological:     Mental Status: She is alert.  Psychiatric:        Mood and Affect: Mood normal.    ED Results and Treatments Labs (all labs ordered are listed, but only abnormal results are displayed) Labs Reviewed  RESP PANEL BY RT-PCR (RSV, FLU A&B, COVID)  RVPGX2  GROUP A STREP BY PCR  MONONUCLEOSIS SCREEN                                                                                                                           Radiology No results found.  Pertinent labs & imaging results that were available during my care of the patient were reviewed by me and considered in my medical decision making (see MDM for details).  Medications Ordered in ED Medications  lidocaine (XYLOCAINE) 2 % viscous mouth solution 15 mL (15 mLs Mouth/Throat Given 01/27/24 0837)  dexamethasone (DECADRON) 10 MG/ML injection for Pediatric ORAL use 10 mg (10 mg Oral Given 01/27/24 0836)                                                                                                                                     Procedures Procedures  (including critical care time)  Medical Decision Making / ED Course   This patient presents to the ED for concern of sore throat, this involves an extensive number of treatment options, and is a complaint that carries with it a high risk of complications and  morbidity.  The differential diagnosis includes PTA, Retropharyngeal abscess, Ludwig's Angina, Epiglottitis, Bacterial/Viral pharyngitis, Strep Throat, Mononucleosis, diptheria, acute HIV infection, Oral Candidiasis,  MDM: Patient seen emerged from for evaluation of sore throat.  Physical exam with some mild oropharyngeal erythema, tonsillar swelling and very minimal exudate but no uvular deviation.  Patient has full range of motion of the neck.  No trismus.  COVID, flu, RSV, strep, mono all negative.  Patient given oral Decadron and this is lidocaine and I reevaluation symptoms have improved.  With overall reassuring exam and minimal head and neck cancer risk factors, will defer CT imaging at this time.  At this time she does not meet inpatient criteria for admission and I did place an outpatient ENT consult for possible laryngoscopy if globus sensation does not improve.  Will trial a short Medrol Dosepak.  Given return precautions of which she voiced understanding she was discharged outpatient follow-up.   Additional history  obtained:  -External records from outside source obtained and reviewed including: Chart review including previous notes, labs, imaging, consultation notes   Lab Tests: -I ordered, reviewed, and interpreted labs.   The pertinent results include:   Labs Reviewed  RESP PANEL BY RT-PCR (RSV, FLU A&B, COVID)  RVPGX2  GROUP A STREP BY PCR  MONONUCLEOSIS SCREEN     Medicines ordered and prescription drug management: Meds ordered this encounter  Medications   lidocaine (XYLOCAINE) 2 % viscous mouth solution 15 mL   dexamethasone (DECADRON) 10 MG/ML injection for Pediatric ORAL use 10 mg    -I have reviewed the patients home medicines and have made adjustments as needed  Critical interventions none   Social Determinants of Health:  Factors impacting patients care include: none   Reevaluation: After the interventions noted above, I reevaluated the patient and found that they have :improved  Co morbidities that complicate the patient evaluation  Past Medical History:  Diagnosis Date   UTI (lower urinary tract infection)       Dispostion: I considered admission for this patient, but at this time she does not meet inpatient criteria for admission and will be discharged outpatient follow-up.     Final Clinical Impression(s) / ED Diagnoses Final diagnoses:  None     @PCDICTATION @    Glendora Score, MD 01/27/24 [phone]
